# Patient Record
Sex: Male | Born: 1955 | Race: White | Hispanic: No | Marital: Married | Smoking: Never smoker
Health system: Southern US, Community
[De-identification: ages and names within clinical notes are randomized; demographics above are authoritative.]

## PROBLEM LIST (undated history)

## (undated) DIAGNOSIS — I10 Essential (primary) hypertension: Secondary | ICD-10-CM

## (undated) DIAGNOSIS — K219 Gastro-esophageal reflux disease without esophagitis: Secondary | ICD-10-CM

## (undated) HISTORY — PX: HERNIA REPAIR: SHX51

## (undated) HISTORY — DX: Gastro-esophageal reflux disease without esophagitis: K21.9

## (undated) HISTORY — DX: Essential (primary) hypertension: I10

---

## 2005-03-22 ENCOUNTER — Ambulatory Visit: Payer: Self-pay | Admitting: Unknown Physician Specialty

## 2008-06-04 ENCOUNTER — Ambulatory Visit: Payer: Self-pay | Admitting: Unknown Physician Specialty

## 2011-10-19 ENCOUNTER — Ambulatory Visit: Payer: Self-pay | Admitting: Unknown Physician Specialty

## 2014-09-17 ENCOUNTER — Ambulatory Visit: Payer: Self-pay | Admitting: Unknown Physician Specialty

## 2016-11-29 ENCOUNTER — Encounter: Payer: Self-pay | Admitting: Urology

## 2016-11-29 ENCOUNTER — Ambulatory Visit (INDEPENDENT_AMBULATORY_CARE_PROVIDER_SITE_OTHER): Payer: Managed Care, Other (non HMO) | Admitting: Urology

## 2016-11-29 VITALS — BP 111/66 | HR 88 | Ht 71.0 in | Wt 211.7 lb

## 2016-11-29 DIAGNOSIS — R972 Elevated prostate specific antigen [PSA]: Secondary | ICD-10-CM

## 2016-11-29 DIAGNOSIS — N400 Enlarged prostate without lower urinary tract symptoms: Secondary | ICD-10-CM | POA: Diagnosis not present

## 2016-11-29 NOTE — Progress Notes (Signed)
11/29/2016 11:12 AM   Harold Phillips June 11, 1956 [id number]  Referring provider: Danella Penton, MD [phone] [facility] [address] [facility] [place], [place] [address]  Chief Complaint  Patient presents with  . Elevated PSA    HPI: The patient is a 61 year old gentleman presents today to discuss his elevated PSA.  1. Elevated PSA Patient has never had a prostate biopsy before. He endorses no personal or family history of prostate cancer.  PSA History: 4/18 7.45 9/17 4.85 4/17 6.04 4/16 4.16  2. BPH IPSS score today is 4/1. He has nocturia 1. He endorses a less than 1 times out of 5 file at incomplete emptying, frequency, intermittency. He denies urgency, weak stream, and straining.     PMH: Past Medical History:  Diagnosis Date  . GERD (gastroesophageal reflux disease)   . Hypertension     Surgical History: Past Surgical History:  Procedure Laterality Date  . HERNIA REPAIR      Home Medications:  Allergies as of 11/29/2016      Reactions   Penicillins Rash      Medication List       Accurate as of 11/29/16 11:12 AM. Always use your most recent med list.          lisinopril-hydrochlorothiazide 20-12.5 MG tablet Commonly known as:  PRINZIDE,ZESTORETIC Take by mouth.       Allergies:  Allergies  Allergen Reactions  . Penicillins Rash    Family History: Family History  Problem Relation Age of Onset  . Prostate cancer Neg Hx   . Bladder Cancer Neg Hx   . Kidney cancer Neg Hx     Social History:  reports that he has never smoked. He has never used smokeless tobacco. He reports that he drinks alcohol. He reports that he does not use drugs.  ROS: UROLOGY Frequent Urination?: No Hard to postpone urination?: No Burning/pain with urination?: No Get up at night to urinate?: Yes Leakage of urine?: No Urine stream starts and stops?: No Trouble starting stream?: No Do you have to strain to urinate?: No Blood in urine?:  No Urinary tract infection?: No Sexually transmitted disease?: No Injury to kidneys or bladder?: No Painful intercourse?: No Weak stream?: No Erection problems?: No Penile pain?: No  Gastrointestinal Nausea?: No Vomiting?: No Indigestion/heartburn?: No Diarrhea?: No Constipation?: No  Constitutional Fever: No Night sweats?: No Weight loss?: No Fatigue?: No  Skin Skin rash/lesions?: No Itching?: No  Eyes Blurred vision?: No Double vision?: No  Ears/Nose/Throat Sore throat?: No Sinus problems?: No  Hematologic/Lymphatic Swollen glands?: No Easy bruising?: No  Cardiovascular Leg swelling?: No Chest pain?: No  Respiratory Cough?: No Shortness of breath?: No  Endocrine Excessive thirst?: No  Musculoskeletal Back pain?: No Joint pain?: No  Neurological Headaches?: No Dizziness?: No  Psychologic Depression?: No Anxiety?: No  Physical Exam: BP 111/66 (BP Location: Left Arm, Patient Position: Sitting, Cuff Size: Normal)   Pulse 88   Ht 5\' 11"  (1.803 m)   Wt 211 lb 11.2 oz (96 kg)   BMI 29.53 kg/m   Constitutional:  Alert and oriented, No acute distress. HEENT: [place] AT, moist mucus membranes.  Trachea midline, no masses. Cardiovascular: No clubbing, cyanosis, or edema. Respiratory: Normal respiratory effort, no increased work of breathing. GI: Abdomen is soft, nontender, nondistended, no abdominal masses GU: No CVA tenderness. Normal phallus. Testicles descended bilaterally without masses. DRE: 2+ benign Skin: No rashes, bruises or suspicious lesions. Lymph: No cervical or inguinal adenopathy. Neurologic: Grossly intact,  no focal deficits, moving all 4 extremities. Psychiatric: Normal mood and affect.  Laboratory Data: No results found for: WBC, HGB, HCT, MCV, PLT  No results found for: CREATININE  No results found for: PSA  No results found for: TESTOSTERONE  No results found for: HGBA1C  Urinalysis No results found for: COLORURINE,  APPEARANCEUR, LABSPEC, PHURINE, GLUCOSEU, HGBUR, BILIRUBINUR, KETONESUR, PROTEINUR, UROBILINOGEN, NITRITE, LEUKOCYTESUR  Assessment & Plan:    1. Elevated PSA I discussed with the patient that he's had multiple elevated PSAs over the last 2 years, they tend to be trending towards higher levels. We discussed the recommendation at this point would be to undergo a prostate biopsy. He understands the risks, benefits, indications of this procedure. Understands the risks include but are not limited to bleeding and infection. He understands there will be blood in stool and urine from the 48 hours and his semen for up to 6 weeks. He understands there is a 1% risk of infection despite periprocedural antibiotics requiring hospitalization due to sepsis. All questions answered. The patient has elected to proceed.  2. BPH Asymptomatic. No treatment necessary  No Follow-up on file.  Hildred LaserBrian James Darcee Dekker, MD  [facility] [address], [address] [place], [place] [address] [phone]

## 2017-01-03 ENCOUNTER — Other Ambulatory Visit: Payer: Self-pay | Admitting: Urology

## 2017-01-03 ENCOUNTER — Encounter: Payer: Self-pay | Admitting: Urology

## 2017-01-03 ENCOUNTER — Ambulatory Visit (INDEPENDENT_AMBULATORY_CARE_PROVIDER_SITE_OTHER): Payer: Managed Care, Other (non HMO) | Admitting: Urology

## 2017-01-03 VITALS — BP 135/82 | HR 74 | Ht 71.0 in | Wt 210.6 lb

## 2017-01-03 DIAGNOSIS — R972 Elevated prostate specific antigen [PSA]: Secondary | ICD-10-CM | POA: Diagnosis not present

## 2017-01-03 MED ORDER — GENTAMICIN SULFATE 40 MG/ML IJ SOLN
80.0000 mg | Freq: Once | INTRAMUSCULAR | Status: AC
  Administered 2017-01-03: 80 mg via INTRAMUSCULAR

## 2017-01-03 MED ORDER — LIDOCAINE HCL 2 % EX GEL
1.0000 "application " | Freq: Once | CUTANEOUS | Status: AC
  Administered 2017-01-03: 1 via URETHRAL

## 2017-01-03 MED ORDER — LEVOFLOXACIN 500 MG PO TABS
500.0000 mg | ORAL_TABLET | Freq: Once | ORAL | Status: AC
  Administered 2017-01-03: 500 mg via ORAL

## 2017-01-03 NOTE — Progress Notes (Signed)
Prostate Biopsy Procedure   Informed consent was obtained after discussing risks/benefits of the procedure.  A time out was performed to ensure correct patient identity.  Pre-Procedure: - Last PSA Level: 7.45 - Gentamicin given prophylactically - Levaquin 500 mg administered PO -Transrectal Ultrasound performed revealing a 61.56 gm prostate -No significant hypoechoic or median lobe noted  Procedure: - Prostate block performed using 10 cc 1% lidocaine and biopsies taken from sextant areas, a total of 12 under ultrasound guidance.  Post-Procedure: - Patient tolerated the procedure well - He was counseled to seek immediate medical attention if experiences any severe pain, significant bleeding, or fevers - Return in one week to discuss biopsy results

## 2017-01-08 LAB — PATHOLOGY REPORT

## 2017-01-09 ENCOUNTER — Other Ambulatory Visit: Payer: Self-pay | Admitting: Urology

## 2017-01-17 ENCOUNTER — Ambulatory Visit: Payer: Managed Care, Other (non HMO) | Admitting: Urology

## 2017-01-17 ENCOUNTER — Telehealth: Payer: Self-pay | Admitting: Urology

## 2017-01-17 NOTE — Telephone Encounter (Signed)
Gave patient biopsy results over the phone. Answered all questions. Made his 6 month follow up app and lab app to patient along with his results.  Harold DusterMichelle

## 2017-07-17 ENCOUNTER — Other Ambulatory Visit: Payer: Self-pay

## 2017-07-17 DIAGNOSIS — R972 Elevated prostate specific antigen [PSA]: Secondary | ICD-10-CM

## 2017-07-19 ENCOUNTER — Other Ambulatory Visit: Payer: Managed Care, Other (non HMO)

## 2017-07-19 DIAGNOSIS — R972 Elevated prostate specific antigen [PSA]: Secondary | ICD-10-CM

## 2017-07-20 LAB — PSA: Prostate Specific Ag, Serum: 6.2 ng/mL — ABNORMAL HIGH (ref 0.0–4.0)

## 2017-07-26 ENCOUNTER — Ambulatory Visit: Payer: Managed Care, Other (non HMO)

## 2017-08-08 ENCOUNTER — Encounter: Payer: Self-pay | Admitting: Urology

## 2017-08-08 ENCOUNTER — Ambulatory Visit (INDEPENDENT_AMBULATORY_CARE_PROVIDER_SITE_OTHER): Payer: Managed Care, Other (non HMO) | Admitting: Urology

## 2017-08-08 VITALS — BP 136/83 | HR 75 | Ht 71.0 in | Wt 217.7 lb

## 2017-08-08 DIAGNOSIS — R972 Elevated prostate specific antigen [PSA]: Secondary | ICD-10-CM

## 2017-08-08 NOTE — Progress Notes (Signed)
08/08/2017 4:12 PM   Harold Phillips 01-08-1956 [id number]  Referring provider: Danella PentonMiller, Mark F, MD [phone] [address] [facility] [place], [place] [address]  Chief Complaint  Patient presents with  . Elevated PSA    HPI: The patient is a 62 year old gentleman presents today to discuss his elevated PSA.  1. Elevated PSA Patient had negative prostate biopsy in July 2018 for PSA 7.45.  PSA since decreased to 6.2.  He endorses no personal or family history of prostate cancer.  PSA History: 1/19 6.2 4/18 7.45 9/17 4.85 4/17 6.04 4/16 4.16         PMH: Past Medical History:  Diagnosis Date  . GERD (gastroesophageal reflux disease)   . Hypertension     Surgical History: Past Surgical History:  Procedure Laterality Date  . HERNIA REPAIR      Home Medications:  Allergies as of 08/08/2017      Reactions   Penicillins Rash      Medication List        Accurate as of 08/08/17  4:12 PM. Always use your most recent med list.          lisinopril-hydrochlorothiazide 20-12.5 MG tablet Commonly known as:  PRINZIDE,ZESTORETIC Take by mouth.       Allergies:  Allergies  Allergen Reactions  . Penicillins Rash    Family History: Family History  Problem Relation Age of Onset  . Prostate cancer Neg Hx   . Bladder Cancer Neg Hx   . Kidney cancer Neg Hx     Social History:  reports that  has never smoked. he has never used smokeless tobacco. He reports that he drinks alcohol. He reports that he does not use drugs.  ROS: UROLOGY Frequent Urination?: No Hard to postpone urination?: No Burning/pain with urination?: No Get up at night to urinate?: Yes Leakage of urine?: No Urine stream starts and stops?: No Trouble starting stream?: No Do you have to strain to urinate?: No Blood in urine?: No Urinary tract infection?: No Sexually transmitted disease?: No Injury to kidneys or bladder?: No Painful intercourse?: No Weak stream?:  No Erection problems?: No Penile pain?: No  Gastrointestinal Nausea?: No Vomiting?: No Indigestion/heartburn?: No Diarrhea?: No Constipation?: No  Constitutional Fever: No Night sweats?: No Weight loss?: No Fatigue?: No  Skin Skin rash/lesions?: No Itching?: No  Eyes Blurred vision?: No Double vision?: No  Ears/Nose/Throat Sore throat?: No Sinus problems?: No  Hematologic/Lymphatic Swollen glands?: No Easy bruising?: No  Cardiovascular Leg swelling?: No Chest pain?: No  Respiratory Cough?: No Shortness of breath?: No  Endocrine Excessive thirst?: No  Musculoskeletal Back pain?: No Joint pain?: No  Neurological Headaches?: No Dizziness?: No  Psychologic Depression?: No Anxiety?: No  Physical Exam: BP 136/83 (BP Location: Right Arm, Patient Position: Sitting, Cuff Size: Large)   Pulse 75   Ht 5\' 11"  (1.803 m)   Wt 217 lb 11.2 oz (98.7 kg)   BMI 30.36 kg/m   Constitutional:  Alert and oriented, No acute distress. HEENT: [place] AT, moist mucus membranes.  Trachea midline, no masses. Cardiovascular: No clubbing, cyanosis, or edema. Respiratory: Normal respiratory effort, no increased work of breathing. GI: Abdomen is soft, nontender, nondistended, no abdominal masses GU: No CVA tenderness.  Skin: No rashes, bruises or suspicious lesions. Lymph: No cervical or inguinal adenopathy. Neurologic: Grossly intact, no focal deficits, moving all 4 extremities. Psychiatric: Normal mood and affect.  Laboratory Data: No results found for: WBC, HGB, HCT, MCV, PLT  No  results found for: CREATININE  No results found for: PSA  No results found for: TESTOSTERONE  No results found for: HGBA1C  Urinalysis No results found for: COLORURINE, APPEARANCEUR, LABSPEC, PHURINE, GLUCOSEU, HGBUR, BILIRUBINUR, KETONESUR, PROTEINUR, UROBILINOGEN, NITRITE, LEUKOCYTESUR   Assessment & Plan:    1.  Elevated PSA I discussed the patient his PSA is no lower than it was  at the time of his prostate biopsy though it does remain elevated.  He will follow-up in 6 months the PSA prior for DRE.  Return in about 6 months (around 02/05/2018) for PSA prior.  Hildred Laser, MD  [facility] [address], [address] [place], [place] [address] [phone]

## 2018-02-03 ENCOUNTER — Other Ambulatory Visit: Payer: Managed Care, Other (non HMO)

## 2018-02-03 DIAGNOSIS — R972 Elevated prostate specific antigen [PSA]: Secondary | ICD-10-CM

## 2018-02-04 LAB — PSA: PROSTATE SPECIFIC AG, SERUM: 6.7 ng/mL — AB (ref 0.0–4.0)

## 2018-02-06 ENCOUNTER — Ambulatory Visit: Payer: Managed Care, Other (non HMO) | Admitting: Urology

## 2018-02-18 ENCOUNTER — Encounter: Payer: Self-pay | Admitting: Urology

## 2018-02-18 ENCOUNTER — Ambulatory Visit (INDEPENDENT_AMBULATORY_CARE_PROVIDER_SITE_OTHER): Payer: Managed Care, Other (non HMO) | Admitting: Urology

## 2018-02-18 VITALS — BP 135/83 | HR 73 | Ht 71.0 in | Wt 215.9 lb

## 2018-02-18 DIAGNOSIS — Z125 Encounter for screening for malignant neoplasm of prostate: Secondary | ICD-10-CM

## 2018-02-18 NOTE — Progress Notes (Signed)
   02/18/2018 4:00 PM   Harold Phillips May 14, 1956 [id number]  Reason for visit: Follow up elevated PSA  HPI: Harold Phillips is a 62 year old male with no family history of prostate cancer here for follow-up of elevated PSA.  Briefly, his PSAs have varied over the last few years between 4 and 7.5.  He did undergo a negative biopsy in July 2018 which showed a 62 g gland.  He denies urinary symptoms or gross hematuria.  Denies weight loss or bone pain.   ROS: Please see flowsheet from today's date for complete review of systems.  Physical Exam: BP 135/83 (BP Location: Left Arm, Patient Position: Sitting, Cuff Size: Normal)   Pulse 73   Ht 5\' 11"  (1.803 m)   Wt 215 lb 14.4 oz (97.9 kg)   BMI 30.11 kg/m   Constitutional:  Alert and oriented, No acute distress. Respiratory: Normal respiratory effort, no increased work of breathing. GI: Abdomen is soft, nontender, nondistended, no abdominal masses DRE: 60g smooth, no nodules Skin: No rashes, bruises or suspicious lesions. Neurologic: Grossly intact, no focal deficits, moving all 4 extremities. Psychiatric: Normal mood and affect  Laboratory Data:  PSA History: 01/2018: 6.7 09/2017: 5.15 07/2017: 6.2 09/2016: 7.45 (Neg bx, 62g gland) 03/2016: 4.85 10/2015: 6.04 10/2014: 4.16  Assessment & Plan:   In summary, Harold Phillips is a 62 year old male with history of elevated PSA between 4 and 7.5, and negative biopsy in 2018 showing a 62 g gland. We reviewed the implications of an elevated PSA and the uncertainty surrounding it. In general, a man's PSA increases with age and is produced by both normal and cancerous prostate tissue. The differential diagnosis for elevated PSA includes BPH, prostate cancer, infection, recent intercourse/ejaculation, recent urethroscopic manipulation (foley placement/cystoscopy) or trauma, and prostatitis.   Management of an elevated PSA can include observation or prostate biopsy and we discussed this in detail.  Our goal is to detect clinically significant prostate cancers, and manage with either active surveillance, surgery, or radiation for localized disease. Risks of prostate biopsy include bleeding, infection (including life threatening sepsis), pain, and lower urinary symptoms. Hematuria, hematospermia, and blood in the stool are all common after biopsy and can persist up to 4 weeks.   After discussion, we will plan to see him back in 6 months with a repeat PSA with a free and total ratio.  I suspect with his large gland and prior negative biopsy this is a benign elevation.  If PSA rises with a concerning free to total ratio, will consider prostate MRI in the future.   Return in about 6 months (around 08/21/2018) for RTC 6 months with PSA (Free/total) prior.  Sondra ComeBrian C Jager Koska, MD  [facility] [address], [address] [place], [place] [address] [phone]

## 2018-08-12 ENCOUNTER — Other Ambulatory Visit: Payer: Managed Care, Other (non HMO)

## 2018-08-12 DIAGNOSIS — Z125 Encounter for screening for malignant neoplasm of prostate: Secondary | ICD-10-CM

## 2018-08-13 LAB — PSA, TOTAL AND FREE
PROSTATE SPECIFIC AG, SERUM: 6.6 ng/mL — AB (ref 0.0–4.0)
PSA, Free Pct: 22.6 %
PSA, Free: 1.49 ng/mL

## 2018-08-19 ENCOUNTER — Encounter: Payer: Self-pay | Admitting: Urology

## 2018-08-19 ENCOUNTER — Ambulatory Visit (INDEPENDENT_AMBULATORY_CARE_PROVIDER_SITE_OTHER): Payer: No Typology Code available for payment source | Admitting: Urology

## 2018-08-19 VITALS — BP 143/80 | HR 82 | Ht 71.0 in | Wt 218.5 lb

## 2018-08-19 DIAGNOSIS — Z125 Encounter for screening for malignant neoplasm of prostate: Secondary | ICD-10-CM | POA: Diagnosis not present

## 2018-08-19 NOTE — Patient Instructions (Signed)

## 2018-08-19 NOTE — Progress Notes (Signed)
   08/19/2018 4:07 PM   Harold Phillips 20-Dec-1955 [id number]  Reason for visit: Follow up PSA screening  HPI: I saw Harold Phillips back in urology clinic for PSA screening and history of elevated PSA.  Briefly he is a healthy 63 year old male with no family history of prostate cancer who has had long history of elevated PSA from 5-7.  He underwent a negative prostate biopsy in July 2018 with Dr. Sherryl Barters for a PSA of 7.5, that showed a prostate volume of 62 g.  At our last visit 6 months ago, his PSA was stable 6.7, and we agreed to repeat the PSA in 6 months with a free/total ratio.  He denies any new complaints since her last visit and is overall doing well.   ROS: Please see flowsheet from today's date for complete review of systems.  Physical Exam: BP (!) 143/80 (BP Location: Left Arm, Patient Position: Sitting, Cuff Size: Normal)   Pulse 82   Ht 5\' 11"  (1.803 m)   Wt 218 lb 8 oz (99.1 kg)   BMI 30.47 kg/m    Laboratory Data: PSA History: 08/2018: 6.6, 22% free 01/2018: 6.7 09/2017: 5.15 07/2017: 6.2 09/2016: 7.45 (Neg bx, 62g gland) 03/2016: 4.85 10/2015: 6.04 10/2014: 4.16  Assessment & Plan:   In summary, the patient is a healthy 63 year old male with no family history of prostate cancer, long history of elevated PSAs from 5-7, history of negative prostate biopsy with a 62 g gland for PSA of 7.5 in 2018, and currently stable PSA at 6.6(22% free) from 6.7 six months prior.  We had a long conversation about PSA screening today, and his reassuring stable values with history of negative biopsy.  I recommended ongoing screening with repeat PSA/DRE in 1 year.    -RTC 1 year with PSA/DRE -If he is found to have an elevated PSA in the future, would consider 4K score or MRI prior to pursuing repeat biopsy.  A total of 15 minutes were spent face-to-face with the patient, greater than 50% was spent in patient education, counseling, and coordination of care regarding PSA screening and  risks and benefits.   Sondra Come, MD  [facility] [address], [address] [place], [place] [address] [phone]

## 2019-06-12 ENCOUNTER — Other Ambulatory Visit: Payer: Self-pay | Admitting: Urology

## 2019-06-12 DIAGNOSIS — R972 Elevated prostate specific antigen [PSA]: Secondary | ICD-10-CM

## 2019-07-07 ENCOUNTER — Other Ambulatory Visit: Payer: Self-pay

## 2019-07-07 ENCOUNTER — Ambulatory Visit
Admission: RE | Admit: 2019-07-07 | Discharge: 2019-07-07 | Disposition: A | Discharge status: Home / Self Care | Payer: PRIVATE HEALTH INSURANCE | Source: Ambulatory Visit | Attending: Urology | Admitting: Urology

## 2019-07-07 DIAGNOSIS — R972 Elevated prostate specific antigen [PSA]: Secondary | ICD-10-CM

## 2019-07-07 MED ORDER — GADOBENATE DIMEGLUMINE 529 MG/ML IV SOLN
20.0000 mL | Freq: Once | INTRAVENOUS | Status: AC | PRN
  Administered 2019-07-07: 20 mL via INTRAVENOUS

## 2019-08-17 ENCOUNTER — Other Ambulatory Visit: Payer: No Typology Code available for payment source

## 2019-08-20 ENCOUNTER — Ambulatory Visit: Payer: No Typology Code available for payment source | Admitting: Urology

## 2020-01-25 IMAGING — MR MR PROSTATE WO/W CM
56 series · 56 of 56 positions shown · IV contrast (MULTIHANCE)
Comparison: None

CLINICAL DATA: History of elevated PSA and biopsy. PSA of 18.1.
Biopsy negative.

EXAM:
MR PROSTATE WITHOUT AND WITH CONTRAST
TECHNIQUE: Multiplanar multisequence MRI images were obtained of the pelvis
centered about the prostate. Pre and post contrast images were
obtained.
CONTRAST:  20mL MULTIHANCE GADOBENATE DIMEGLUMINE 529 MG/ML IV SOLN
Creatinine was obtained on site at [HOSPITAL] at [HOSPITAL].
Results: Creatinine 0.9 mg/dL.

[Series 3: bSSFP fat-sat · axial · 8.0mm · 0.74mm/px · 1 of 28 slices shown]
[im 1/28]
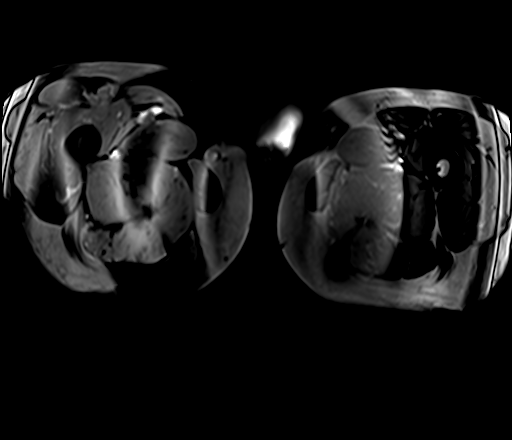

[Series 4: T1 · axial · 5.0mm · 1.25mm/px · 1 of 80 slices shown]
[im 1/80]
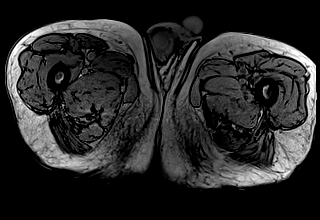

[Series 5: T2 · coronal · 3.5mm · 0.56mm/px · 1 of 23 slices shown (1 of 3)]
[im 1/23]
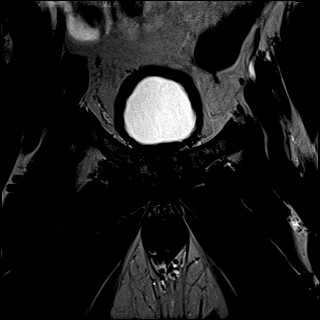

[Series 6: DWI · axial · 3.5mm · 1.56mm/px · 1 of 72 slices shown (1 of 5)]
[im 1/72]
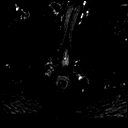

[Series 7: DWI · axial · 3.5mm · 1.56mm/px · 1 of 24 slices shown (2 of 5)]
[im 1/24]
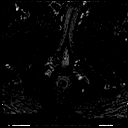

[Series 8: DWI · axial · 3.5mm · 1.75mm/px · 1 of 69 slices shown (3 of 5)]
[im 1/69]
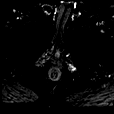

[Series 9: DWI · axial · 3.5mm · 1.75mm/px · 1 of 23 slices shown (4 of 5)]
[im 1/23]
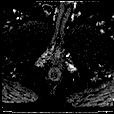

[Series 10: DWI · axial · 3.5mm · 1.56mm/px · 1 of 23 slices shown (5 of 5)]
[im 1/23]
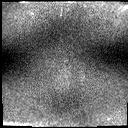

[Series 11: T2 · axial · 3.5mm · 0.56mm/px · 1 of 23 slices shown (2 of 3)]
[im 1/23]
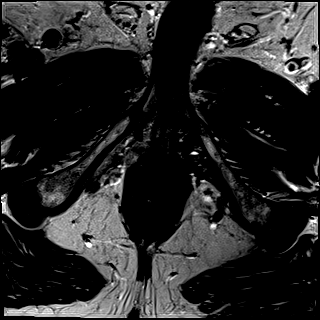

[Series 12: T2 · axial · 1.0mm · 1.04mm/px · 1 of 80 slices shown (3 of 3)]
[im 1/80]
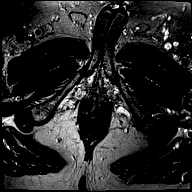

[Series 13: pre t1_twist_tra_dyn_ttc=5.8s · axial · non-contrast · 3.5mm · 0.83mm/px · 1 of 22 slices shown]
[im 1/22]
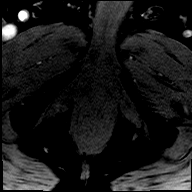

[Series 14: post t1_twist_tra_dyn-copy center · axial · 3.5mm · 0.83mm/px · 1 of 22 slices shown (1 of 23)]
[im 1/22]
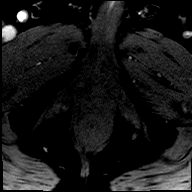

[Series 15: post t1_twist_tra_dyn-copy center · axial · 3.5mm · 0.83mm/px · 1 of 22 slices shown (2 of 23)]
[im 1/22]
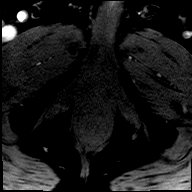

[Series 16: post t1_twist_tra_dyn-copy cent_sub_ttc=(id) · axial · 3.5mm · 0.83mm/px · 1 of 22 slices shown (1 of 22)]
[im 1/22]
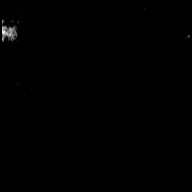

[Series 17: post t1_twist_tra_dyn-copy center · axial · 3.5mm · 0.83mm/px · 1 of 22 slices shown (3 of 23)]
[im 1/22]
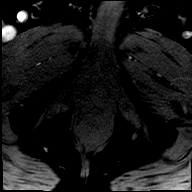

[Series 18: post t1_twist_tra_dyn-copy cent_sub_ttc=(id) · axial · 3.5mm · 0.83mm/px · 1 of 22 slices shown (2 of 22)]
[im 1/22]
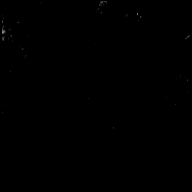

[Series 19: post t1_twist_tra_dyn-copy center · axial · 3.5mm · 0.83mm/px · 1 of 22 slices shown (4 of 23)]
[im 1/22]
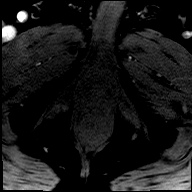

[Series 20: post t1_twist_tra_dyn-copy cent_sub_ttc=(id) · axial · 3.5mm · 0.83mm/px · 1 of 22 slices shown (3 of 22)]
[im 1/22]
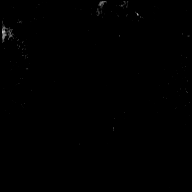

[Series 21: post t1_twist_tra_dyn-copy center · axial · 3.5mm · 0.83mm/px · 1 of 22 slices shown (5 of 23)]
[im 1/22]
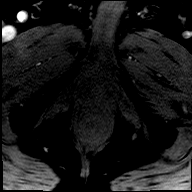

[Series 22: post t1_twist_tra_dyn-copy cent_sub_ttc=(id) · axial · 3.5mm · 0.83mm/px · 1 of 22 slices shown (4 of 22)]
[im 1/22]
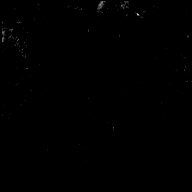

[Series 23: post t1_twist_tra_dyn-copy center · axial · 3.5mm · 0.83mm/px · 1 of 22 slices shown (6 of 23)]
[im 1/22]
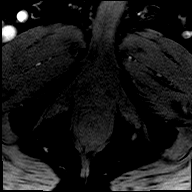

[Series 24: post t1_twist_tra_dyn-copy cent_sub_ttc=(id) · axial · 3.5mm · 0.83mm/px · 1 of 22 slices shown (5 of 22)]
[im 1/22]
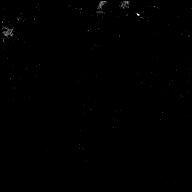

[Series 25: post t1_twist_tra_dyn-copy center · axial · 3.5mm · 0.83mm/px · 1 of 22 slices shown (7 of 23)]
[im 1/22]
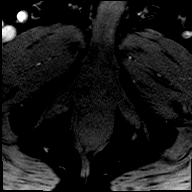

[Series 26: post t1_twist_tra_dyn-copy cent_sub_ttc=(id) · axial · 3.5mm · 0.83mm/px · 1 of 22 slices shown (6 of 22)]
[im 1/22]
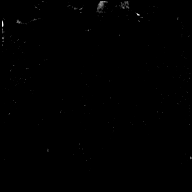

[Series 27: post t1_twist_tra_dyn-copy center · axial · 3.5mm · 0.83mm/px · 1 of 22 slices shown (8 of 23)]
[im 1/22]
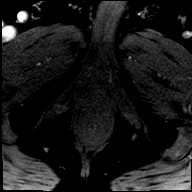

[Series 28: post t1_twist_tra_dyn-copy cent_sub_ttc=(id) · axial · 3.5mm · 0.83mm/px · 1 of 22 slices shown (7 of 22)]
[im 1/22]
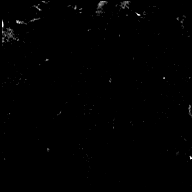

[Series 29: post t1_twist_tra_dyn-copy center · axial · 3.5mm · 0.83mm/px · 1 of 22 slices shown (9 of 23)]
[im 1/22]
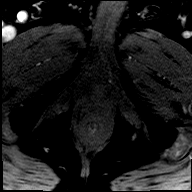

[Series 30: post t1_twist_tra_dyn-copy cent_sub_ttc=(id) · axial · 3.5mm · 0.83mm/px · 1 of 22 slices shown (8 of 22)]
[im 1/22]
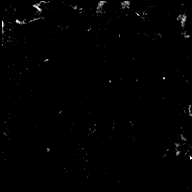

[Series 31: post t1_twist_tra_dyn-copy center · axial · 3.5mm · 0.83mm/px · 1 of 22 slices shown (10 of 23)]
[im 1/22]
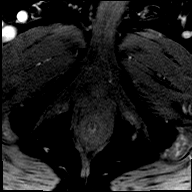

[Series 32: post t1_twist_tra_dyn-copy cent_sub_ttc=(id) · axial · 3.5mm · 0.83mm/px · 1 of 22 slices shown (9 of 22)]
[im 1/22]
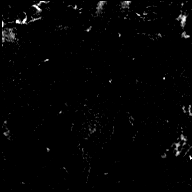

[Series 33: post t1_twist_tra_dyn-copy center · axial · 3.5mm · 0.83mm/px · 1 of 22 slices shown (11 of 23)]
[im 1/22]
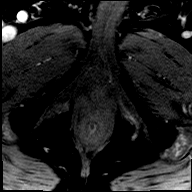

[Series 34: post t1_twist_tra_dyn-copy cent_sub_ttc=(id) · axial · 3.5mm · 0.83mm/px · 1 of 22 slices shown (10 of 22)]
[im 1/22]
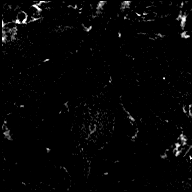

[Series 35: post t1_twist_tra_dyn-copy center · axial · 3.5mm · 0.83mm/px · 1 of 22 slices shown (12 of 23)]
[im 1/22]
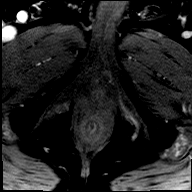

[Series 36: post t1_twist_tra_dyn-copy cent_sub_ttc=(id) · axial · 3.5mm · 0.83mm/px · 1 of 22 slices shown (11 of 22)]
[im 1/22]
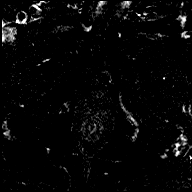

[Series 37: post t1_twist_tra_dyn-copy center · axial · 3.5mm · 0.83mm/px · 1 of 22 slices shown (13 of 23)]
[im 1/22]
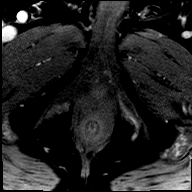

[Series 38: post t1_twist_tra_dyn-copy cent_sub_ttc=(id) · axial · 3.5mm · 0.83mm/px · 1 of 22 slices shown (12 of 22)]
[im 1/22]
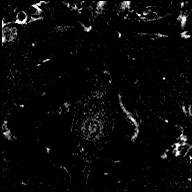

[Series 39: post t1_twist_tra_dyn-copy center · axial · 3.5mm · 0.83mm/px · 1 of 22 slices shown (14 of 23)]
[im 1/22]
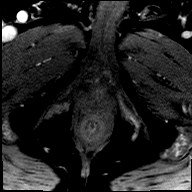

[Series 40: post t1_twist_tra_dyn-copy cent_sub_ttc=(id) · axial · 3.5mm · 0.83mm/px · 1 of 22 slices shown (13 of 22)]
[im 1/22]
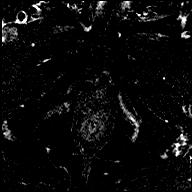

[Series 41: post t1_twist_tra_dyn-copy center · axial · 3.5mm · 0.83mm/px · 1 of 22 slices shown (15 of 23)]
[im 1/22]
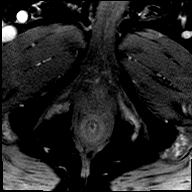

[Series 42: post t1_twist_tra_dyn-copy cent_sub_ttc=(id) · axial · 3.5mm · 0.83mm/px · 1 of 21 slices shown (14 of 22)]
[im 1/21]
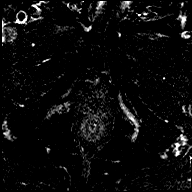

[Series 43: post t1_twist_tra_dyn-copy center · axial · 3.5mm · 0.83mm/px · 1 of 22 slices shown (16 of 23)]
[im 1/22]
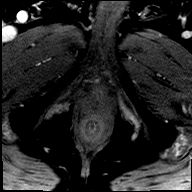

[Series 44: post t1_twist_tra_dyn-copy cent_sub_ttc=(id) · axial · 3.5mm · 0.83mm/px · 1 of 21 slices shown (15 of 22)]
[im 1/21]
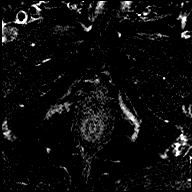

[Series 45: post t1_twist_tra_dyn-copy center · axial · 3.5mm · 0.83mm/px · 1 of 22 slices shown (17 of 23)]
[im 1/22]
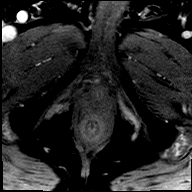

[Series 46: post t1_twist_tra_dyn-copy cent_sub_ttc=(id) · axial · 3.5mm · 0.83mm/px · 1 of 21 slices shown (16 of 22)]
[im 1/21]
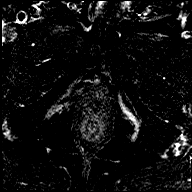

[Series 47: post t1_twist_tra_dyn-copy center · axial · 3.5mm · 0.83mm/px · 1 of 22 slices shown (18 of 23)]
[im 1/22]
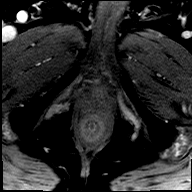

[Series 48: post t1_twist_tra_dyn-copy cent_sub_ttc=(id) · axial · 3.5mm · 0.83mm/px · 1 of 21 slices shown (17 of 22)]
[im 1/21]
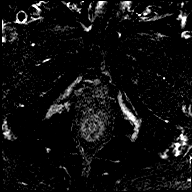

[Series 49: post t1_twist_tra_dyn-copy center · axial · 3.5mm · 0.83mm/px · 1 of 22 slices shown (19 of 23)]
[im 1/22]
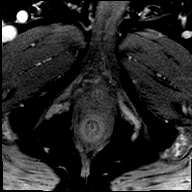

[Series 50: post t1_twist_tra_dyn-copy cent_sub_ttc=(id) · axial · 3.5mm · 0.83mm/px · 1 of 21 slices shown (18 of 22)]
[im 1/21]
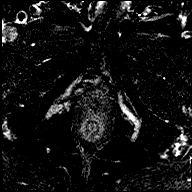

[Series 51: post t1_twist_tra_dyn-copy center · axial · 3.5mm · 0.83mm/px · 1 of 22 slices shown (20 of 23)]
[im 1/22]
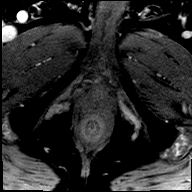

[Series 52: post t1_twist_tra_dyn-copy cent_sub_ttc=(id) · axial · 3.5mm · 0.83mm/px · 1 of 22 slices shown (19 of 22)]
[im 1/22]
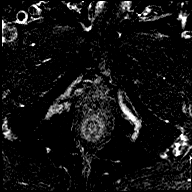

[Series 53: post t1_twist_tra_dyn-copy center · axial · 3.5mm · 0.83mm/px · 1 of 22 slices shown (21 of 23)]
[im 1/22]
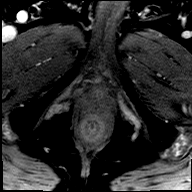

[Series 54: post t1_twist_tra_dyn-copy cent_sub_ttc=(id) · axial · 3.5mm · 0.83mm/px · 1 of 22 slices shown (20 of 22)]
[im 1/22]
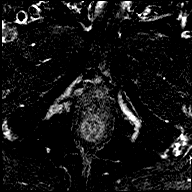

[Series 55: post t1_twist_tra_dyn-copy center · axial · 3.5mm · 0.83mm/px · 1 of 22 slices shown (22 of 23)]
[im 1/22]
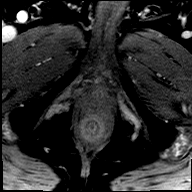

[Series 56: post t1_twist_tra_dyn-copy cent_sub_ttc=(id) · axial · 3.5mm · 0.83mm/px · 1 of 22 slices shown (21 of 22)]
[im 1/22]
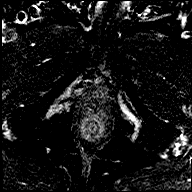

[Series 57: post t1_twist_tra_dyn-copy center · axial · 3.5mm · 0.83mm/px · 1 of 22 slices shown (23 of 23)]
[im 1/22]
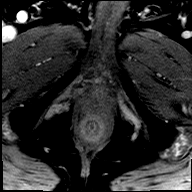

[Series 58: post t1_twist_tra_dyn-copy cent_sub_ttc=(id) · axial · 3.5mm · 0.83mm/px · 1 of 22 slices shown (22 of 22)]
[im 1/22]
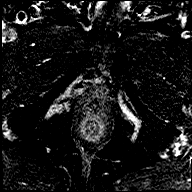

[56 of 56 positions shown; findings below may reference images not displayed]

FINDINGS: Prostate:

Peripheral zone: Areas of linear wedge-shaped T2 hypointensity
without corresponding restricted diffusion.

Transitional zone: Extensive changes of BPH with expansion of the
central gland and without focal area of T2 signal abnormality to
suggest high-risk lesion there is however a tiny focus of restricted
diffusion seen on the high B value diffusion weighted images
corresponding to low signal on the ADC amidst an area of marked
heterogeneity and nodularity with BPH change in the left mid gland
biased towards the base (image 11, series 10) at the margins of some
of the BPH nodules there is less well-circumscribed homogeneous
hypointensity on T2 for instance on image 10 of series 11.

Area along the anterior apex showing mildly heterogeneous T2
predominantly hypointense with indistinct margins, lacking diffusion
characteristics that would meet criteria for PIRADS category 5.
Overall assessment PIRADS category 3 (image 16, series 11 measuring
13 x 10.8 mm just to the right of midline in the apex anteriorly.

Volume: 60 cc

Transcapsular spread:  Absent

Seminal vesicle involvement: Absent

Neurovascular bundle involvement: Absent

Pelvic adenopathy: Absent

Bone metastasis: Absent

Other findings: Signs of colonic diverticulosis.
IMPRESSION: 1. Small area of restricted diffusion in an area of less
well-circumscribed T2 hypointensity within the left transitional
zone, PIRADS category 4.
2. PIRADS category 3 lesion in the mid gland biased towards the apex
anteriorly, just to the right of midline.
3. No signs of nodal disease or bone lesion with limited imaging of
the pelvis.
